# Patient Record
Sex: Male | Born: 1980 | Race: Black or African American | Hispanic: No | Marital: Single | Smoking: Current every day smoker
Health system: Southern US, Community
[De-identification: ages and names within clinical notes are randomized; demographics above are authoritative.]

---

## 2013-01-04 ENCOUNTER — Emergency Department (HOSPITAL_COMMUNITY)
Admission: EM | Admit: 2013-01-04 | Discharge: 2013-01-04 | Disposition: A | Discharge status: Home / Self Care | Payer: Self-pay | Attending: Emergency Medicine | Admitting: Emergency Medicine

## 2013-01-04 ENCOUNTER — Encounter (HOSPITAL_COMMUNITY): Payer: Self-pay | Admitting: Emergency Medicine

## 2013-01-04 ENCOUNTER — Emergency Department (HOSPITAL_COMMUNITY): Payer: Self-pay

## 2013-01-04 DIAGNOSIS — R51 Headache: Secondary | ICD-10-CM | POA: Insufficient documentation

## 2013-01-04 DIAGNOSIS — F17200 Nicotine dependence, unspecified, uncomplicated: Secondary | ICD-10-CM | POA: Insufficient documentation

## 2013-01-04 DIAGNOSIS — R079 Chest pain, unspecified: Secondary | ICD-10-CM | POA: Insufficient documentation

## 2013-01-04 DIAGNOSIS — R5381 Other malaise: Secondary | ICD-10-CM | POA: Insufficient documentation

## 2013-01-04 DIAGNOSIS — R509 Fever, unspecified: Secondary | ICD-10-CM | POA: Insufficient documentation

## 2013-01-04 DIAGNOSIS — J111 Influenza due to unidentified influenza virus with other respiratory manifestations: Secondary | ICD-10-CM

## 2013-01-04 DIAGNOSIS — R61 Generalized hyperhidrosis: Secondary | ICD-10-CM | POA: Insufficient documentation

## 2013-01-04 MED ORDER — ACETAMINOPHEN 325 MG PO TABS
650.0000 mg | ORAL_TABLET | Freq: Once | ORAL | Status: AC
  Administered 2013-01-04: 650 mg via ORAL
  Filled 2013-01-04: qty 2

## 2013-01-04 MED ORDER — HYDROCOD POLST-CHLORPHEN POLST 10-8 MG/5ML PO LQCR: 5.0000 mL | Freq: Two times a day (BID) | ORAL | Status: DC | PRN

## 2013-01-04 NOTE — ED Notes (Signed)
Pt presents with EMS, c/o fever, cough, and rib pain. He is hot to touch. Last dose of Tylenol was 5-6 hours ago. Pt also c/o HA. Sx's onset 2 days ago. Denies N/V/D.  

## 2013-01-04 NOTE — ED Notes (Signed)
Pt admits to cough and night sweats. States really bad headache and pain in his ribs when he coughs. States that for the last 3 days he has been very tired. He works 2 jobs and unable to go since Wednesday of last week. He needs a MD note to return.  Denies SOB. No DOE with ambulation. Pt was ambulatory upon arrival with EMS.

## 2013-01-04 NOTE — ED Provider Notes (Signed)
CSN: [id number]     Arrival date & time 01/04/13  0251 History   First MD Initiated Contact with Patient 01/04/13 0252     No chief complaint on file.  (Consider location/radiation/quality/duration/timing/severity/associated sxs/prior Treatment) HPI 32 year old male presents to emergency room with 3 days of fatigue, headache, cough, rib pain, fever, or night sweats.  No flu shot.  Patient reports he has had contact with sick contacts.  No shortness of breath, no dyspnea on ambulation.  Patient's been taking Tylenol and ibuprofen.  No neck stiffness. History reviewed. No pertinent past medical history. History reviewed. No pertinent past surgical history. No family history on file. History  Substance Use Topics  . Smoking status: Current Every Day Smoker -- 0.50 packs/day for 5 years    Types: Cigarettes  . Smokeless tobacco: Not on file  . Alcohol Use: Yes     Comment: pt states not a heavy drinker or does not smoke often    Review of Systems  See History of Present Illness; otherwise all other systems are reviewed and negative Allergies  Review of patient's allergies indicates no known allergies.  Home Medications   Current Outpatient Rx  Name  Route  Sig  Dispense  Refill  . acetaminophen (TYLENOL) 500 MG tablet   Oral   Take 1,000 mg by mouth every 6 (six) hours as needed for mild pain or headache.         . ibuprofen (ADVIL,MOTRIN) 200 MG tablet   Oral   Take 600 mg by mouth every 8 (eight) hours as needed for headache or moderate pain.         . chlorpheniramine-HYDROcodone (TUSSIONEX PENNKINETIC ER) 10-8 MG/5ML LQCR   Oral   Take 5 mLs by mouth every 12 (twelve) hours as needed for cough.   115 mL   0    BP 125/68  Pulse 76  Temp(Src) 99.2 F (37.3 C) (Oral)  Resp 16  SpO2 99% Physical Exam  Nursing note and vitals reviewed. Constitutional: He is oriented to person, place, and time. He appears well-developed and well-nourished.  HENT:  Head:  Normocephalic and atraumatic.  Right Ear: External ear normal.  Left Ear: External ear normal.  Nose: Nose normal.  Mouth/Throat: Oropharynx is clear and moist.  Eyes: Conjunctivae and EOM are normal. Pupils are equal, round, and reactive to light.  Neck: Normal range of motion. Neck supple. No JVD present. No tracheal deviation present. No thyromegaly present.  Cool rigidity, patient with full range of motion of the neck.  No increase in headache with flexion or extension  Cardiovascular: Normal rate, regular rhythm, normal heart sounds and intact distal pulses.  Exam reveals no gallop and no friction rub.   No murmur heard. Pulmonary/Chest: Effort normal and breath sounds normal. No stridor. No respiratory distress. He has no wheezes. He has no rales. He exhibits no tenderness.  Abdominal: Soft. Bowel sounds are normal. He exhibits no distension and no mass. There is no tenderness. There is no rebound and no guarding.  Musculoskeletal: Normal range of motion. He exhibits no edema and no tenderness.  Lymphadenopathy:    He has no cervical adenopathy.  Neurological: He is alert and oriented to person, place, and time. He has normal reflexes. No cranial nerve deficit. He exhibits normal muscle tone. Coordination normal.  Skin: Skin is warm and dry. No rash noted. No erythema. No pallor.  Psychiatric: He has a normal mood and affect. His behavior is normal. Judgment and thought content  normal.    ED Course  Procedures (including critical care time) Labs Review Labs Reviewed  RESPIRATORY VIRUS PANEL   Imaging Review Dg Chest 2 View  01/04/2013   CLINICAL DATA:  Chest pain.  EXAM: CHEST  2 VIEW  COMPARISON:  None.  FINDINGS: The heart size and mediastinal contours are within normal limits. Both lungs are clear of consolidation or edema. Evidence of calcified granuloma at the left base. The visualized skeletal structures are unremarkable.  IMPRESSION: No active cardiopulmonary disease.    Electronically Signed   By: Tiburcio Pea M.D.   On: 01/04/2013 04:34    EKG Interpretation   None       MDM   1. Influenza-like illness    33 year old with 3 days of fatigue, fever, bodyaches, cough, and headache.  No signs of nuchal rigidity, or meningitis.  On exam.  Patient most likely has flulike illness.  Will treat with Tylenol, ibuprofen, and Tussionex.  Work note given.    Olivia Mackie, MD 01/05/13 [phone]

## 2013-01-05 LAB — RESPIRATORY VIRUS PANEL
Adenovirus: NOT DETECTED
Influenza A H1: NOT DETECTED
Influenza A H3: DETECTED — AB
Influenza A: DETECTED — AB
Parainfluenza 1: NOT DETECTED
Respiratory Syncytial Virus A: NOT DETECTED
Respiratory Syncytial Virus B: NOT DETECTED
Rhinovirus: NOT DETECTED

## 2013-11-21 ENCOUNTER — Emergency Department (HOSPITAL_COMMUNITY)
Admission: EM | Admit: 2013-11-21 | Discharge: 2013-11-21 | Disposition: A | Discharge status: Home / Self Care | Payer: Self-pay | Attending: Emergency Medicine | Admitting: Emergency Medicine

## 2013-11-21 ENCOUNTER — Encounter (HOSPITAL_COMMUNITY): Payer: Self-pay | Admitting: Emergency Medicine

## 2013-11-21 ENCOUNTER — Emergency Department (HOSPITAL_COMMUNITY): Payer: Self-pay

## 2013-11-21 DIAGNOSIS — J209 Acute bronchitis, unspecified: Secondary | ICD-10-CM | POA: Insufficient documentation

## 2013-11-21 DIAGNOSIS — J40 Bronchitis, not specified as acute or chronic: Secondary | ICD-10-CM

## 2013-11-21 DIAGNOSIS — Z720 Tobacco use: Secondary | ICD-10-CM | POA: Insufficient documentation

## 2013-11-21 MED ORDER — HYDROCODONE-ACETAMINOPHEN 5-325 MG PO TABS: 2.0000 | ORAL_TABLET | ORAL | Status: DC | PRN

## 2013-11-21 MED ORDER — ACETAMINOPHEN 325 MG PO TABS
650.0000 mg | ORAL_TABLET | Freq: Once | ORAL | Status: AC
  Administered 2013-11-21: 650 mg via ORAL
  Filled 2013-11-21: qty 2

## 2013-11-21 MED ORDER — AZITHROMYCIN 250 MG PO TABS: ORAL_TABLET | ORAL | Status: DC

## 2013-11-21 NOTE — ED Notes (Signed)
Pt ambulatory upon DC. He reports he is leaving with ALL belongings he arrived with. Pt was advised to obtain a PCP and was given [facility].

## 2013-11-21 NOTE — ED Provider Notes (Signed)
CSN: [id number]     Arrival date & time 11/21/13  0216 History   First MD Initiated Contact with Patient 11/21/13 0604     Chief Complaint  Patient presents with  . Cough  . Chest Pain     (Consider location/radiation/quality/duration/timing/severity/associated sxs/prior Treatment) Patient is a 33 y.o. male presenting with cough. The history is provided by the patient. No language interpreter was used.  Cough Cough characteristics:  Productive Severity:  Moderate Onset quality:  Gradual Duration:  7 days Timing:  Constant Progression:  Worsening Chronicity:  New Smoker: yes   Context: upper respiratory infection   Relieved by:  Nothing Worsened by:  Nothing tried Associated symptoms: shortness of breath   Associated symptoms: no chest pain and no fever   Risk factors: no recent infection   Pt reports he has had fever and cough for a week.  Pt reports pain in ribs from coughing  History reviewed. No pertinent past medical history. History reviewed. No pertinent past surgical history. History reviewed. No pertinent family history. History  Substance Use Topics  . Smoking status: Current Every Day Smoker -- 0.50 packs/day for 5 years    Types: Cigarettes  . Smokeless tobacco: Not on file  . Alcohol Use: Yes     Comment: pt states not a heavy drinker or does not smoke often    Review of Systems  Constitutional: Negative for fever.  Respiratory: Positive for cough and shortness of breath.   Cardiovascular: Negative for chest pain.  All other systems reviewed and are negative.     Allergies  Review of patient's allergies indicates no known allergies.  Home Medications   Prior to Admission medications   Medication Sig Start Date End Date Taking? Authorizing Provider  ibuprofen (ADVIL,MOTRIN) 200 MG tablet Take 400 mg by mouth every 8 (eight) hours as needed for headache or moderate pain.    Yes Historical Provider, MD  acetaminophen (TYLENOL) 500 MG tablet Take  1,000 mg by mouth every 6 (six) hours as needed for mild pain or headache.    Historical Provider, MD  chlorpheniramine-HYDROcodone (TUSSIONEX PENNKINETIC ER) 10-8 MG/5ML LQCR Take 5 mLs by mouth every 12 (twelve) hours as needed for cough. 01/04/13   Olivia Mackielga M Otter, MD   BP 123/75 mmHg  Pulse 88  Temp(Src) 102.5 F (39.2 C) (Oral)  Resp 20  Ht 5\' 7"  (1.702 m)  Wt 150 lb (68.04 kg)  BMI 23.49 kg/m2  SpO2 99% Physical Exam  Constitutional: He is oriented to person, place, and time. He appears well-developed and well-nourished.  HENT:  Head: Normocephalic and atraumatic.  Right Ear: External ear normal.  Left Ear: External ear normal.  Nose: Nose normal.  Mouth/Throat: Oropharynx is clear and moist.  Eyes: Conjunctivae and EOM are normal. Pupils are equal, round, and reactive to light.  Neck: Normal range of motion.  Cardiovascular: Normal rate and normal heart sounds.   Pulmonary/Chest: Effort normal and breath sounds normal.  Abdominal: Soft. He exhibits no distension.  Musculoskeletal: Normal range of motion.  Neurological: He is alert and oriented to person, place, and time.  Skin: Skin is warm.  Psychiatric: He has a normal mood and affect.  Nursing note and vitals reviewed.   ED Course  Procedures (including critical care time) Labs Review Labs Reviewed - No data to display  Imaging Review Dg Chest 2 View  11/21/2013   CLINICAL DATA:  Cough for 5 days.  Smoker.  EXAM: CHEST  2 VIEW  COMPARISON:  01/04/2013  FINDINGS: Hyperinflation in the lungs with mild peribronchial thickening suggesting chronic bronchitis. No focal airspace disease or consolidation. No blunting of costophrenic angles. No pneumothorax. Heart size and pulmonary vascularity are normal. Mediastinal contours appear intact. No change since previous study.  IMPRESSION: Pulmonary hyperinflation with chronic bronchitic changes. No evidence of active pulmonary disease.   Electronically Signed   By: Burman NievesWilliam  Stevens  M.D.   On: 11/21/2013 03:14     EKG Interpretation None      MDM     Final diagnoses:  Bronchitis   zithromax Hydrocodone for pain.    Return if any problems.     Elson AreasLeslie K Mariusz Jubb, PA-C 11/21/13 [phone]  Elson AreasLeslie K Kaston Faughn, PA-C 11/21/13 [phone]  Tomasita CrumbleAdeleke Oni, MD 11/21/13 [phone]

## 2013-11-21 NOTE — Discharge Instructions (Signed)

## 2013-11-21 NOTE — ED Notes (Signed)
Pt arrived to the ED with a complaint of rib pain.  Pt states he has not been feeling well since donating plasma last week.  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  Pt states his rib pain is pressure from within and feels worse when he coughs.

## 2014-02-24 ENCOUNTER — Emergency Department (HOSPITAL_COMMUNITY)
Admission: EM | Admit: 2014-02-24 | Discharge: 2014-02-24 | Disposition: A | Discharge status: Home / Self Care | Payer: Self-pay | Attending: Emergency Medicine | Admitting: Emergency Medicine

## 2014-02-24 ENCOUNTER — Encounter (HOSPITAL_COMMUNITY): Payer: Self-pay | Admitting: Emergency Medicine

## 2014-02-24 DIAGNOSIS — Z79899 Other long term (current) drug therapy: Secondary | ICD-10-CM | POA: Insufficient documentation

## 2014-02-24 DIAGNOSIS — Z720 Tobacco use: Secondary | ICD-10-CM | POA: Insufficient documentation

## 2014-02-24 DIAGNOSIS — N4889 Other specified disorders of penis: Secondary | ICD-10-CM

## 2014-02-24 DIAGNOSIS — N508 Other specified disorders of male genital organs: Secondary | ICD-10-CM | POA: Insufficient documentation

## 2014-02-24 MED ORDER — METRONIDAZOLE 500 MG PO TABS
2000.0000 mg | ORAL_TABLET | Freq: Once | ORAL | Status: AC
  Administered 2014-02-24: 2000 mg via ORAL
  Filled 2014-02-24: qty 4

## 2014-02-24 NOTE — ED Provider Notes (Signed)
CSN: [id number]     Arrival date & time 02/24/14  1759 History  This chart was scribed for non-physician practitioner Aija Scarfo A. Hector Shade, PA-C working with Richardean Canal, MD by Conchita Paris, ED Scribe. This patient was seen in [facility] and the patient's care was started at 8:44 PM.   Chief Complaint  Patient presents with  . Exposure to STD   HPI  HPI Comments: Andre Lucas is a 34 y.o. male who presents to the Emergency Department complaining of possible exposure to trichomonas one week ago. He denies penile discharge, dysuria, testicular pain or swelling and fever.  History reviewed. No pertinent past medical history. History reviewed. No pertinent past surgical history. History reviewed. No pertinent family history. History  Substance Use Topics  . Smoking status: Current Every Day Smoker -- 0.50 packs/day for 5 years    Types: Cigarettes  . Smokeless tobacco: Not on file  . Alcohol Use: Yes     Comment: pt states not a heavy drinker or does not smoke often    Review of Systems  Constitutional: Negative for fever.  Gastrointestinal: Negative.   Genitourinary: Negative.  Negative for dysuria, discharge, scrotal swelling and testicular pain.  Skin: Negative.     Allergies  Review of patient's allergies indicates no known allergies.  Home Medications   Prior to Admission medications   Medication Sig Start Date End Date Taking? Authorizing Provider  acetaminophen (TYLENOL) 500 MG tablet Take 1,000 mg by mouth every 6 (six) hours as needed for mild pain or headache.   Yes Historical Provider, MD  cetirizine (ZYRTEC) 10 MG tablet Take 10 mg by mouth daily.   Yes Historical Provider, MD  ibuprofen (ADVIL,MOTRIN) 200 MG tablet Take 400 mg by mouth every 8 (eight) hours as needed for headache or moderate pain.    Yes Historical Provider, MD  azithromycin (ZITHROMAX Z-PAK) 250 MG tablet Two tablets 1st day then one a day days 2-5 Patient not taking: Reported on 02/24/2014 11/21/13    Elson Areas, PA-C  chlorpheniramine-HYDROcodone [facility]) 10-8 MG/5ML LQCR Take 5 mLs by mouth every 12 (twelve) hours as needed for cough. Patient not taking: Reported on 02/24/2014 01/04/13   Olivia Mackie, MD  HYDROcodone-acetaminophen (NORCO/VICODIN) 5-325 MG per tablet Take 2 tablets by mouth every 4 (four) hours as needed. Patient not taking: Reported on 02/24/2014 11/21/13   Elson Areas, PA-C   BP 129/70 mmHg  Pulse 89  Temp(Src) 98.4 F (36.9 C) (Oral)  Resp 18  SpO2 99% Physical Exam  Constitutional: He is oriented to person, place, and time. He appears well-developed and well-nourished.  HENT:  Head: Normocephalic and atraumatic.  Eyes: EOM are normal.  Neck: Normal range of motion.  Cardiovascular: Normal rate.   Pulmonary/Chest: Effort normal.  Genitourinary:  circumcised penis without discharge rash or redness No testicular pain or swelling. No inguinal adenopathy.  Musculoskeletal: Normal range of motion.  Neurological: He is alert and oriented to person, place, and time.  Skin: Skin is warm and dry.  Psychiatric: He has a normal mood and affect. His behavior is normal.  Nursing note and vitals reviewed.   ED Course  Procedures  DIAGNOSTIC STUDIES: Oxygen Saturation is 99% on room air, normal by my interpretation.    COORDINATION OF CARE: 8:50 PM Discussed treatment plan with pt at bedside and pt agreed to plan.  Labs Review Labs Reviewed - No data to display  Imaging Review No results found.   EKG  Interpretation None      MDM   Final diagnoses:  None  1. STD exposure  He reports his girlfriend has trichomonas. He denies symptoms of discharge, fever, pain. He has a normal exam. Discussed treatment for trichomonas, written information provided to patient. Discussed testing for other STD and patient prefers [facility] with [facility] for these tests.  I personally performed the services described in this documentation,  which was scribed in my presence. The recorded information has been reviewed and is accurate.       Arnoldo HookerShari A Alonnah Lampkins, PA-C 02/25/14 0058  Richardean Canalavid H Yao, MD 02/26/14 [phone]

## 2014-02-24 NOTE — ED Notes (Signed)
Patient presents to Ed with c/o exposure to STD.  Pt reports that he was just told by partner that his partner has been diagnosed of STD--- pt started to "itch all over his penis"; pt denies rashes or discharges.  Pt came to ED for STD evaluation.

## 2014-02-24 NOTE — Discharge Instructions (Signed)
Trichomoniasis Trichomoniasis is an infection caused by an organism called Trichomonas. The infection can affect both women and men. In women, the outer male genitalia and the vagina are affected. In men, the penis is mainly affected, but the prostate and other reproductive organs can also be involved. Trichomoniasis is a sexually transmitted infection (STI) and is most often passed to another person through sexual contact.  RISK FACTORS  Having unprotected sexual intercourse.  Having sexual intercourse with an infected partner. SIGNS AND SYMPTOMS  Symptoms of trichomoniasis in women include:  Abnormal gray-green frothy vaginal discharge.  Itching and irritation of the vagina.  Itching and irritation of the area outside the vagina. Symptoms of trichomoniasis in men include:   Penile discharge with or without pain.  Pain during urination. This results from inflammation of the urethra. DIAGNOSIS  Trichomoniasis may be found during a Pap test or physical exam. Your health care provider may use one of the following methods to help diagnose this infection:  Examining vaginal discharge under a microscope. For men, urethral discharge would be examined.  Testing the pH of the vagina with a test tape.  Using a vaginal swab test that checks for the Trichomonas organism. A test is available that provides results within a few minutes.  Doing a culture test for the organism. This is not usually needed. TREATMENT   You may be given medicine to fight the infection. Women should inform their health care provider if they could be or are pregnant. Some medicines used to treat the infection should not be taken during pregnancy.  Your health care provider may recommend over-the-counter medicines or creams to decrease itching or irritation.  Your sexual partner will need to be treated if infected. HOME CARE INSTRUCTIONS   Take medicines only as directed by your health care provider.  Take  over-the-counter medicine for itching or irritation as directed by your health care provider.  Do not have sexual intercourse while you have the infection.  Women should not douche or wear tampons while they have the infection.  Discuss your infection with your partner. Your partner may have gotten the infection from you, or you may have gotten it from your partner.  Have your sex partner get examined and treated if necessary.  Practice safe, informed, and protected sex.  See your health care provider for other STI testing. SEEK MEDICAL CARE IF:   You still have symptoms after you finish your medicine.  You develop abdominal pain.  You have pain when you urinate.  You have bleeding after sexual intercourse.  You develop a rash.  Your medicine makes you sick or makes you throw up (vomit). MAKE SURE YOU:  Understand these instructions.  Will watch your condition.  Will get help right away if you are not doing well or get worse. Document Released: 06/27/2000 Document Revised: 05/18/2013 Document Reviewed: 10/13/2012 Exit[employer] Patient Information 2015 [employer]. This information is not intended to replace advice given to you by your health care provider. Make sure you discuss any questions you have with your health care provider.  

## 2016-10-16 ENCOUNTER — Emergency Department (HOSPITAL_COMMUNITY): Payer: Self-pay

## 2016-10-16 ENCOUNTER — Emergency Department (HOSPITAL_COMMUNITY)
Admission: EM | Admit: 2016-10-16 | Discharge: 2016-10-16 | Disposition: A | Discharge status: Home / Self Care | Payer: Self-pay | Attending: Emergency Medicine | Admitting: Emergency Medicine

## 2016-10-16 ENCOUNTER — Encounter (HOSPITAL_COMMUNITY): Payer: Self-pay | Admitting: Emergency Medicine

## 2016-10-16 DIAGNOSIS — Z79899 Other long term (current) drug therapy: Secondary | ICD-10-CM | POA: Insufficient documentation

## 2016-10-16 DIAGNOSIS — F17210 Nicotine dependence, cigarettes, uncomplicated: Secondary | ICD-10-CM | POA: Insufficient documentation

## 2016-10-16 DIAGNOSIS — M25532 Pain in left wrist: Secondary | ICD-10-CM | POA: Insufficient documentation

## 2016-10-16 DIAGNOSIS — M25531 Pain in right wrist: Secondary | ICD-10-CM

## 2016-10-16 MED ORDER — KETOROLAC TROMETHAMINE 30 MG/ML IJ SOLN
15.0000 mg | Freq: Once | INTRAMUSCULAR | Status: AC
  Administered 2016-10-16: 15 mg via INTRAMUSCULAR
  Filled 2016-10-16: qty 1

## 2016-10-16 NOTE — ED Provider Notes (Signed)
[facility]-EMERGENCY DEPT Provider Note   CSN: [id number] Arrival date & time: 10/16/16  1215     History   Chief Complaint Chief Complaint  Patient presents with  . Wrist Pain    HPI Andre Lucas is a 36 y.o. male without significant past medical history, presenting to the ED for acute onset of bilateral dorsal wrist pain that began about 2 weeks ago. Patient states he began lifting free weights at the gym, and the following day began having b/l wrist pain, right worse than left, that is worse with movement and and weight bearing. 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 He has taken OTC Tylenol, Advil and aspirin, with most relief from Advil. No numbness or tingling to hands swelling, wounds, or any other complaints.  The history is provided by the patient.    History reviewed. No pertinent past medical history.  There are no active problems to display for this patient.   History reviewed. No pertinent surgical history.     Home Medications    Prior to Admission medications   Medication Sig Start Date End Date Taking? Authorizing Provider  acetaminophen (TYLENOL) 500 MG tablet Take 1,000 mg by mouth every 6 (six) hours as needed for mild pain or headache.    [provider]  azithromycin (ZITHROMAX Z-PAK) 250 MG tablet Two tablets 1st day then one a day days 2-5 Patient not taking: Reported on 02/24/2014 11/21/13   Elson Areas, PA-C  cetirizine (ZYRTEC) 10 MG tablet Take 10 mg by mouth daily.    [provider]  chlorpheniramine-HYDROcodone (TUSSIONEX PENNKINETIC ER) 10-8 MG/5ML LQCR Take 5 mLs by mouth every 12 (twelve) hours as needed for cough. Patient not taking: Reported on 02/24/2014 01/04/13   Marisa Severin, MD  HYDROcodone-acetaminophen (NORCO/VICODIN) 5-325 MG per tablet Take 2 tablets by mouth every 4 (four) hours as needed. Patient not taking: Reported on 02/24/2014 11/21/13   Elson Areas,  PA-C  ibuprofen (ADVIL,MOTRIN) 200 MG tablet Take 400 mg by mouth every 8 (eight) hours as needed for headache or moderate pain.     [provider]    Family History No family history on file.  Social History Social History  Substance Use Topics  . Smoking status: Current Every Day Smoker    Packs/day: 0.50    Years: 5.00    Types: Cigarettes  . Smokeless tobacco: Never Used  . Alcohol use Yes     Comment: pt states not a heavy drinker or does not smoke often     Allergies   Patient has no known allergies.   Review of Systems Review of Systems  Musculoskeletal: Positive for arthralgias. Negative for joint swelling.  Skin: Negative for color change.  Neurological: Negative for weakness and numbness.     Physical Exam Updated Vital Signs BP 138/85 (BP Location: Right Arm)   Pulse 67   Temp 97.6 F (36.4 C) (Oral)   Resp 16   Ht  (1.702 m)   Wt 72.6 kg (160 lb)   SpO2 100%   BMI 25.06 kg/m   Physical Exam  Constitutional: He appears well-developed and well-nourished. No distress.  HENT:  Head: Normocephalic and atraumatic.  Eyes: Conjunctivae are normal.  Cardiovascular: Intact distal pulses.   Pulmonary/Chest: Effort normal.  Musculoskeletal:  Bilateral dorsal wrists with tenderness. No edema, erythema, or deformities. Full normal range of motion. No anatomical snuffbox tenderness. Negative Tinel's.  Neurological:  Normal sensation  Psychiatric: He has a normal mood and affect. His behavior is normal.  Nursing note and vitals reviewed.  ED Treatments / Results  Labs (all labs ordered are listed, but only abnormal results are displayed) Labs Reviewed - No data to display  EKG  EKG Interpretation None       Radiology Dg Wrist Complete Left  Result Date: 10/16/2016 CLINICAL DATA:  Pain began after lifting weights. This was 10 days ago. EXAM: LEFT WRIST - COMPLETE 3+ VIEW COMPARISON:  None. FINDINGS: There is no evidence of fracture  or dislocation. There is no evidence of arthropathy or other focal bone abnormality. Soft tissues are unremarkable. IMPRESSION: Negative. Electronically Signed   By: Elsie Stain M.D.   On: 10/16/2016 13:41   Dg Wrist Complete Right  Result Date: 10/16/2016 CLINICAL DATA:  Worsening wrist pain after lifting weights. EXAM: RIGHT WRIST - COMPLETE 3+ VIEW COMPARISON:  None. FINDINGS: There is no evidence of fracture or dislocation. There is no evidence of arthropathy or other focal bone abnormality. Mild ulnar positive variance. Soft tissues are unremarkable. IMPRESSION: Mild ulnar positive variance.  No acute osseous abnormality. Electronically Signed   By: Obie Dredge M.D.   On: 10/16/2016 13:37    Procedures Procedures (including critical care time)  Medications Ordered in ED Medications  ketorolac (TORADOL) 30 MG/ML injection 15 mg (15 mg Intramuscular Given 10/16/16 1308)     Initial Impression / Assessment and Plan / ED Course  I have reviewed the triage vital signs and the nursing notes.  Pertinent labs & imaging results that were available during my care of the patient were reviewed by me and considered in my medical decision making (see chart for details).     Presenting with bilateral wrist pain. No signs or symptoms to suggest carpal tunnel syndrome. NV intact. X-ray of right wrist showing mild positive ulnar variation, left wrist x-rays negative. Will place right wrist in Velcro splint for comfort. RICE. Hand specialist referral given as needed. Patient is safe for discharge.  Patient discussed with Dr. Silverio Lay.  Discussed results, findings, treatment and follow up. Patient advised of return precautions. Patient verbalized understanding and agreed with plan.   Final Clinical Impressions(s) / ED Diagnoses   Final diagnoses:  Bilateral wrist pain    New Prescriptions New Prescriptions   No medications on file     Rus[place], [place] N, PA-C 10/16/16 1357    Charlynne Pander, MD 10/17/16 [phone]

## 2016-10-16 NOTE — Discharge Instructions (Signed)
Please read instructions below. Apply ice to your wrists for 20 minutes at a time. You can take  of Advil every 6 hours as needed for pain. Schedule an appointment with the hand specialist in 1 week for follow-up on your injury. Return to the ER for new or concerning symptoms.

## 2016-10-16 NOTE — ED Triage Notes (Signed)
Pt reports bilat wrist pain, states, "I think it's arthritis." Pt denies recent injury. No swelling noted, pt using both hands.

## 2018-04-04 ENCOUNTER — Ambulatory Visit (HOSPITAL_COMMUNITY)
Admission: EM | Admit: 2018-04-04 | Discharge: 2018-04-04 | Disposition: A | Discharge status: Home / Self Care | Payer: Self-pay | Attending: Family Medicine | Admitting: Family Medicine

## 2018-04-04 ENCOUNTER — Other Ambulatory Visit: Payer: Self-pay

## 2018-04-04 DIAGNOSIS — R0981 Nasal congestion: Secondary | ICD-10-CM

## 2018-04-04 DIAGNOSIS — J302 Other seasonal allergic rhinitis: Secondary | ICD-10-CM

## 2018-04-04 MED ORDER — PREDNISONE 20 MG PO TABS: 20.0000 mg | ORAL_TABLET | Freq: Two times a day (BID) | ORAL | 0 refills | Status: AC

## 2018-04-04 NOTE — ED Provider Notes (Signed)
[facility]    CSN: [id number] Arrival date & time: 04/04/18  1117     History   Chief Complaint Chief Complaint  Patient presents with   Cough   Nasal Congestion    HPI Andre Lucas is a 38 y.o. male.   HPI   Itchy throat and eyes, runny and stuffy nose, mild cough for the last 2 days.  "might be allergies". Sent from work for evaluation for contagiousness.  No fever or chills No chest pian or dyspnea    No past medical history on file.  There are no active problems to display for this patient.   No past surgical history on file.     Home Medications    Prior to Admission medications   Medication Sig Start Date End Date Taking? Authorizing Provider  acetaminophen (TYLENOL) 500 MG tablet Take 1,000 mg by mouth every 6 (six) hours as needed for mild pain or headache.    [provider]  cetirizine (ZYRTEC) 10 MG tablet Take 10 mg by mouth daily.    [provider]  ibuprofen (ADVIL,MOTRIN) 200 MG tablet Take 400 mg by mouth every 8 (eight) hours as needed for headache or moderate pain.     [provider]  predniSONE (DELTASONE) 20 MG tablet Take 1 tablet (20 mg total) by mouth 2 (two) times daily with a meal. 04/04/18   Eustace Moore, MD    Family History No family history on file.  Social History Social History   Tobacco Use   Smoking status: Current Every Day Smoker    Packs/day: 0.50    Years: 5.00    Pack years: 2.50    Types: Cigarettes   Smokeless tobacco: Never Used  Substance Use Topics   Alcohol use: Yes    Comment: pt states not a heavy drinker or does not smoke often   Drug use: Yes    Types: Marijuana     Allergies   Patient has no known allergies.   Review of Systems Review of Systems  Constitutional: Negative for chills and fever.  HENT: Positive for congestion and rhinorrhea. Negative for ear pain and sore throat.   Eyes: Positive for itching. Negative for pain and visual  disturbance.  Respiratory: Negative for cough and shortness of breath.   Cardiovascular: Negative for chest pain and palpitations.  Gastrointestinal: Negative for abdominal pain and vomiting.  Genitourinary: Negative for dysuria and hematuria.  Musculoskeletal: Negative for arthralgias and back pain.  Skin: Negative for color change and rash.  Neurological: Negative for seizures and syncope.  All other systems reviewed and are negative.    Physical Exam Triage Vital Signs ED Triage Vitals  Enc Vitals Group     BP 04/04/18 1130 125/80     Pulse Rate 04/04/18 1130 69     Resp 04/04/18 1130 16     Temp 04/04/18 1130 98.2 F (36.8 C)     Temp Source 04/04/18 1130 Oral     SpO2 04/04/18 1130 100 %     Weight 04/04/18 1132 160 lb (72.6 kg)     Height 04/04/18 1132 5\' 7"  (1.702 m)     Head Circumference --      Peak Flow --      Pain Score 04/04/18 1132 0     Pain Loc --      Pain Edu? --      Excl. in GC? --    No data found.  Updated Vital  Signs BP 125/80 (BP Location: Right Arm)    Pulse 69    Temp 98.2 F (36.8 C) (Oral)    Resp 16    Ht 5\' 7"  (1.702 m)    Wt 72.6 kg    SpO2 100%    BMI 25.06 kg/m      Physical Exam Constitutional:      General: He is not in acute distress.    Appearance: He is well-developed.  HENT:     Head: Normocephalic and atraumatic.     Right Ear: Tympanic membrane, ear canal and external ear normal.     Left Ear: Tympanic membrane, ear canal and external ear normal.     Nose: Congestion and rhinorrhea present.     Comments: clear    Mouth/Throat:     Mouth: Mucous membranes are moist.  Eyes:     Conjunctiva/sclera: Conjunctivae normal.     Pupils: Pupils are equal, round, and reactive to light.     Comments: Mild injection  Neck:     Musculoskeletal: Normal range of motion.  Cardiovascular:     Rate and Rhythm: Normal rate and regular rhythm.     Heart sounds: Normal heart sounds.  Pulmonary:     Effort: Pulmonary effort is normal.  No respiratory distress.     Breath sounds: Normal breath sounds.  Abdominal:     General: There is no distension.     Palpations: Abdomen is soft.  Musculoskeletal: Normal range of motion.  Lymphadenopathy:     Cervical: No cervical adenopathy.  Skin:    General: Skin is warm and dry.  Neurological:     General: No focal deficit present.     Mental Status: He is alert.  Psychiatric:        Mood and Affect: Mood normal.        Behavior: Behavior normal.      UC Treatments / Results  Labs (all labs ordered are listed, but only abnormal results are displayed) Labs Reviewed - No data to display  EKG None  Radiology No results found.  Procedures Procedures (including critical care time)  Medications Ordered in UC Medications - No data to display  Initial Impression / Assessment and Plan / UC Course  I have reviewed the triage vital signs and the nursing notes.  Pertinent labs & imaging results that were available during my care of the patient were reviewed by me and considered in my medical decision making (see chart for details).      Final Clinical Impressions(s) / UC Diagnoses   Final diagnoses:  Nasal congestion  Seasonal allergies     Discharge Instructions     I believe your symptoms are mostly caused by allergies.  I am giving you prednisone to take for a few days.  This should give you rapid improvement. In addition go back on the Claritin or Zyrtec for a few days Drink plenty of fluids Expect improvement in the next few days   ED Prescriptions    Medication Sig Dispense Auth. Provider   predniSONE (DELTASONE) 20 MG tablet Take 1 tablet (20 mg total) by mouth 2 (two) times daily with a meal. 10 tablet Eustace Moore, MD     Controlled Substance Prescriptions [place] Controlled Substance Registry consulted? Not Applicable   Eustace Moore, MD 04/04/18 1220

## 2018-04-04 NOTE — Discharge Instructions (Addendum)
I believe your symptoms are mostly caused by allergies.  I am giving you prednisone to take for a few days.  This should give you rapid improvement. In addition go back on the Claritin or Zyrtec for a few days Drink plenty of fluids Expect improvement in the next few days

## 2018-04-04 NOTE — ED Triage Notes (Signed)
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 Pt has had no fevers.

## 2018-07-04 ENCOUNTER — Encounter (HOSPITAL_COMMUNITY): Payer: Self-pay | Admitting: *Deleted

## 2018-07-04 ENCOUNTER — Other Ambulatory Visit: Payer: Self-pay

## 2018-07-04 ENCOUNTER — Ambulatory Visit (HOSPITAL_COMMUNITY)
Admission: EM | Admit: 2018-07-04 | Discharge: 2018-07-04 | Disposition: A | Discharge status: Home / Self Care | Payer: Self-pay | Attending: Internal Medicine | Admitting: Internal Medicine

## 2018-07-04 ENCOUNTER — Telehealth: Payer: Self-pay | Admitting: *Deleted

## 2018-07-04 DIAGNOSIS — Z20822 Contact with and (suspected) exposure to covid-19: Secondary | ICD-10-CM

## 2018-07-04 DIAGNOSIS — R430 Anosmia: Secondary | ICD-10-CM

## 2018-07-04 DIAGNOSIS — R481 Agnosia: Secondary | ICD-10-CM

## 2018-07-04 NOTE — ED Provider Notes (Signed)
[facility]    CSN: [id number] Arrival date & time: 07/04/18  1126     History   Chief Complaint No chief complaint on file.   HPI Andre Lucas is a 38 y.o. male.   Andre Lucas presents with complaints of 3 days of cold sweats at night. Today loss of taste, loss of smell. Feels anxious. No pain anywhere. No cough no shortness of breath . No headache. No body aches. Hasn't checked for fever. No nausea vomiting or diarrhea. No known ill contacts. Off of work currently. States he is concerned about Covid-19. Took aspirin and advil which haven't helped. Without contributing medical history.      ROS per HPI, negative if not otherwise mentioned.      History reviewed. No pertinent past medical history.  There are no active problems to display for this patient.   History reviewed. No pertinent surgical history.     Home Medications    Prior to Admission medications   Medication Sig Start Date End Date Taking? Authorizing Provider  ASPIRIN PO Take by mouth.   Yes [provider]  cetirizine (ZYRTEC) 10 MG tablet Take 10 mg by mouth daily.   Yes [provider]  ibuprofen (ADVIL,MOTRIN) 200 MG tablet Take 400 mg by mouth every 8 (eight) hours as needed for headache or moderate pain.    Yes [provider]  acetaminophen (TYLENOL) 500 MG tablet Take 1,000 mg by mouth every 6 (six) hours as needed for mild pain or headache.    [provider]  predniSONE (DELTASONE) 20 MG tablet Take 1 tablet (20 mg total) by mouth 2 (two) times daily with a meal. 04/04/18   Eustace MooreNelson, Yvonne Sue, MD    Family History Family History  Problem Relation Age of Onset  . Healthy Mother   . Healthy Father     Social History Social History   Tobacco Use  . Smoking status: Current Every Day Smoker    Packs/day: 0.50    Years: 5.00    Pack years: 2.50    Types: Cigarettes  . Smokeless tobacco: Never Used  Substance Use Topics  . Alcohol use:  Yes    Alcohol/week: 8.0 standard drinks    Types: 6 Cans of beer, 2 Shots of liquor per week  . Drug use: Yes    Types: Marijuana     Allergies   Patient has no known allergies.   Review of Systems Review of Systems   Physical Exam Triage Vital Signs ED Triage Vitals  Enc Vitals Group     BP 07/04/18 1230 136/88     Pulse Rate 07/04/18 1230 60     Resp 07/04/18 1230 16     Temp 07/04/18 1230 98 F (36.7 C)     Temp Source 07/04/18 1230 Temporal     SpO2 07/04/18 1230 99 %     Weight --      Height --      Head Circumference --      Peak Flow --      Pain Score 07/04/18 1232 0     Pain Loc --      Pain Edu? --      Excl. in GC? --    No data found.  Updated Vital Signs BP 136/88   Pulse 60   Temp 98 F (36.7 C) (Temporal)   Resp 16   SpO2 99%    Physical Exam Constitutional:  Appearance: He is well-developed.  Cardiovascular:     Rate and Rhythm: Normal rate.  Pulmonary:     Effort: Pulmonary effort is normal.  Skin:    General: Skin is warm and dry.  Neurological:     Mental Status: He is alert and oriented to person, place, and time.      UC Treatments / Results  Labs (all labs ordered are listed, but only abnormal results are displayed) Labs Reviewed - No data to display  EKG None  Radiology No results found.  Procedures Procedures (including critical care time)  Medications Ordered in UC Medications - No data to display  Initial Impression / Assessment and Plan / UC Course  I have reviewed the triage vital signs and the nursing notes.  Pertinent labs & imaging results that were available during my care of the patient were reviewed by me and considered in my medical decision making (see chart for details).     Non toxic. Benign physical exam.  Message to [facility] for covid testing provided, patient is anxious about this. No known potential exposure. Encouraged self isolation pending results. Return precautions provided. Patient  verbalized understanding and agreeable to plan.   Final Clinical Impressions(s) / UC Diagnoses   Final diagnoses:  Loss of perception for taste  Loss of perception for smell     Discharge Instructions     You overall appear well today, your vital signs are normal.  I feel you are low risk, even positive for covid-19.  Due to your symptoms in presence of pandemic I do recommend Covid-19 testing. You will be called to set up an appointment time to be tested at our [facility]. Results take about 2-3 days.  Please self isolate until you receive this results.   If develop chest pain , shortness of breath , or otherwise worsening please return or go to the ER.     ED Prescriptions    None     Controlled Substance Prescriptions  Controlled Substance Registry consulted? Not Applicable   Zigmund Gottron, NP 07/04/18 1332

## 2018-07-04 NOTE — Discharge Instructions (Signed)
You overall appear well today, your vital signs are normal.  I feel you are low risk, even positive for covid-19.  Due to your symptoms in presence of pandemic I do recommend Covid-19 testing. You will be called to set up an appointment time to be tested at our [facility]. Results take about 2-3 days.  Please self isolate until you receive this results.   If develop chest pain , shortness of breath , or otherwise worsening please return or go to the ER.

## 2018-07-04 NOTE — Telephone Encounter (Signed)
Patient scheduled for covid testing Monday 07/07/18 @ [facility]. Instructions given and order placed

## 2018-07-04 NOTE — Telephone Encounter (Signed)
-----   Message from Zigmund Gottron, NP sent at 07/04/2018 12:48 PM EDT ----- Regarding: covid testing needed

## 2018-07-04 NOTE — ED Triage Notes (Signed)
C/O loss of sense of taste & smell x 3 days.  Also c/o "night sweats", but denies any fevers, cough, SOB, pain, or any other sxs.

## 2018-07-07 ENCOUNTER — Other Ambulatory Visit: Payer: Self-pay

## 2018-07-07 DIAGNOSIS — Z20822 Contact with and (suspected) exposure to covid-19: Secondary | ICD-10-CM

## 2018-07-12 ENCOUNTER — Telehealth: Payer: Self-pay

## 2018-07-12 LAB — NOVEL CORONAVIRUS, NAA: SARS-CoV-2, NAA: DETECTED — AB

## 2018-07-12 NOTE — Telephone Encounter (Signed)
Notes recorded by Carlisle Beers, RN on 07/12/2018 at 11:42 AM EDT  Pt notified of Covid 19 results. Discussed signs and symptoms and asked pt to call 911 for any worsening of respiratory issues or distress. Discussed self isolation and when to end self isolation.Pt verbalized understanding.  Contains abnormal data Novel Coronavirus, NAA ([facility]) Order: [id number] Status:  Final result  Visible to patient:  No (not released)  Next appt:  None  Dx:  Suspected Covid-19 Virus Infection     Ref Range & Units 5d ago  SARS-CoV-2, NAA Not Detected DetectedAbnormal    Comment: This test was developed and its performance characteristics determined  by [employer]. This test has

## 2020-06-12 ENCOUNTER — Other Ambulatory Visit: Payer: Self-pay

## 2020-06-12 ENCOUNTER — Emergency Department (HOSPITAL_COMMUNITY)
Admission: EM | Admit: 2020-06-12 | Discharge: 2020-06-12 | Disposition: A | Discharge status: Home / Self Care | Payer: Self-pay | Attending: Emergency Medicine | Admitting: Emergency Medicine

## 2020-06-12 DIAGNOSIS — S39012A Strain of muscle, fascia and tendon of lower back, initial encounter: Secondary | ICD-10-CM | POA: Insufficient documentation

## 2020-06-12 DIAGNOSIS — S46912A Strain of unspecified muscle, fascia and tendon at shoulder and upper arm level, left arm, initial encounter: Secondary | ICD-10-CM | POA: Insufficient documentation

## 2020-06-12 DIAGNOSIS — X500XXA Overexertion from strenuous movement or load, initial encounter: Secondary | ICD-10-CM | POA: Insufficient documentation

## 2020-06-12 DIAGNOSIS — F17210 Nicotine dependence, cigarettes, uncomplicated: Secondary | ICD-10-CM | POA: Insufficient documentation

## 2020-06-12 DIAGNOSIS — Y990 Civilian activity done for income or pay: Secondary | ICD-10-CM | POA: Insufficient documentation

## 2020-06-12 MED ORDER — IBUPROFEN 600 MG PO TABS: 600.0000 mg | ORAL_TABLET | Freq: Four times a day (QID) | ORAL | 0 refills | Status: AC | PRN

## 2020-06-12 MED ORDER — CYCLOBENZAPRINE HCL 10 MG PO TABS
10.0000 mg | ORAL_TABLET | Freq: Two times a day (BID) | ORAL | 0 refills | Status: AC | PRN
Start: 2020-06-12 — End: ?

## 2020-06-12 NOTE — ED Provider Notes (Signed)
[facility] EMERGENCY DEPARTMENT Provider Note   CSN: [id number] Arrival date & time: 06/12/20  0935     History No chief complaint on file.   Andre Lucas is a 40 y.o. male.  The history is provided by the patient. No language interpreter was used.     40 year old male with history of tobacco use, presenting with complaint of pain to his left shoulder and his lower back.  Patient report for the past 4 days he has had intermittent pain about the left shoulder and low back.  Described pain as a tightness sensation worse with movement and with prolonged lifting.  Pain mildly improved with over-the-counter Tylenol and ibuprofen.  He attributed pain to having to perform heavy lifting at work usually lifting between 40 to 60 pounds of weight regularly.  He has been at his job for the past several months.  He denies any associated fever runny nose sneezing coughing shortness of breath dysuria hematuria and he denies any specific injury.  He denies any focal numbness or focal weakness.  No bowel bladder incontinence or saddle anesthesia.  No past medical history on file.  There are no problems to display for this patient.   No past surgical history on file.     Family History  Problem Relation Age of Onset  . Healthy Mother   . Healthy Father     Social History   Tobacco Use  . Smoking status: Current Every Day Smoker    Packs/day: 0.50    Years: 5.00    Pack years: 2.50    Types: Cigarettes  . Smokeless tobacco: Never Used  Vaping Use  . Vaping Use: Never used  Substance Use Topics  . Alcohol use: Yes    Alcohol/week: 8.0 standard drinks    Types: 6 Cans of beer, 2 Shots of liquor per week  . Drug use: Yes    Types: Marijuana    Home Medications Prior to Admission medications   Medication Sig Start Date End Date Taking? Authorizing Provider  acetaminophen (TYLENOL) 500 MG tablet Take 1,000 mg by mouth every 6 (six) hours as needed for mild pain or  headache.    [provider]  ASPIRIN PO Take by mouth.    [provider]  cetirizine (ZYRTEC) 10 MG tablet Take 10 mg by mouth daily.    [provider]  ibuprofen (ADVIL,MOTRIN) 200 MG tablet Take 400 mg by mouth every 8 (eight) hours as needed for headache or moderate pain.     [provider]  predniSONE (DELTASONE) 20 MG tablet Take 1 tablet (20 mg total) by mouth 2 (two) times daily with a meal. 04/04/18   Eustace Moore, MD    Allergies    Patient has no known allergies.  Review of Systems   Review of Systems  Constitutional: Negative for fever.  Musculoskeletal: Positive for arthralgias and back pain.  Skin: Negative for rash and wound.    Physical Exam Updated Vital Signs There were no vitals taken for this visit.  Physical Exam Vitals and nursing note reviewed.  Constitutional:      General: He is not in acute distress.    Appearance: He is well-developed.  HENT:     Head: Atraumatic.  Eyes:     Conjunctiva/sclera: Conjunctivae normal.  Musculoskeletal:        General: Tenderness (Mild tenderness to lumbar paraspinal muscle on palpation without any significant midline spine tenderness.  Normal skin) present.  Cervical back: Neck supple.     Comments: Left shoulder with mild tenderness about the left scapula however shoulder with full range of motion no deformity noted.  Skin:    Findings: No rash.  Neurological:     Mental Status: He is alert.     ED Results / Procedures / Treatments   Labs (all labs ordered are listed, but only abnormal results are displayed) Labs Reviewed - No data to display  EKG None  Radiology No results found.  Procedures Procedures   Medications Ordered in ED Medications - No data to display  ED Course  I have reviewed the triage vital signs and the nursing notes.  Pertinent labs & imaging results that were available during my care of the patient were reviewed by me and considered  in my medical decision making (see chart for details).    MDM Rules/Calculators/A&P                          BP 132/73 (BP Location: Right Arm)   Pulse 89   Temp 98.2 F (36.8 C) (Oral)   Resp 17   Ht 5\' 7"  (1.702 m)   Wt 72.6 kg   SpO2 99%   BMI 25.07 kg/m   Final Clinical Impression(s) / ED Diagnoses Final diagnoses:  Strain of lumbar region, initial encounter  Left shoulder strain, initial encounter    Rx / DC Orders ED Discharge Orders    None     9:54 AM Patient here with pain to left shoulder and lower back from regular heavy lifting.  No specific injury to suggest fracture or dislocation.  No neuro deficit.  No signs of infection.  Ambulate without difficulty.  Will prescribe anti-inflammatory Acacian and muscle relaxant as needed and will give orthopedic referral.  Return precaution given.    , PA-C 06/12/20 1121    [place], 06/14/20, DO 06/12/20 1336

## 2020-06-12 NOTE — ED Notes (Signed)
Reviewed discharge instructions with patient. Follow-up care and medications reviewed. Patient  verbalized understanding. Patient A&Ox4, VSS, and ambulatory with steady gait upon discharge.  °

## 2020-09-01 ENCOUNTER — Encounter (HOSPITAL_COMMUNITY): Payer: Self-pay

## 2020-09-01 ENCOUNTER — Emergency Department (HOSPITAL_COMMUNITY)
Admission: EM | Admit: 2020-09-01 | Discharge: 2020-09-01 | Disposition: A | Discharge status: Home / Self Care | Payer: Self-pay | Attending: Emergency Medicine | Admitting: Emergency Medicine

## 2020-09-01 ENCOUNTER — Other Ambulatory Visit: Payer: Self-pay

## 2020-09-01 ENCOUNTER — Emergency Department (HOSPITAL_COMMUNITY): Payer: Self-pay

## 2020-09-01 DIAGNOSIS — G8929 Other chronic pain: Secondary | ICD-10-CM

## 2020-09-01 DIAGNOSIS — X500XXA Overexertion from strenuous movement or load, initial encounter: Secondary | ICD-10-CM | POA: Insufficient documentation

## 2020-09-01 DIAGNOSIS — S39012A Strain of muscle, fascia and tendon of lower back, initial encounter: Secondary | ICD-10-CM | POA: Insufficient documentation

## 2020-09-01 DIAGNOSIS — F17210 Nicotine dependence, cigarettes, uncomplicated: Secondary | ICD-10-CM | POA: Insufficient documentation

## 2020-09-01 DIAGNOSIS — Y9389 Activity, other specified: Secondary | ICD-10-CM | POA: Insufficient documentation

## 2020-09-01 DIAGNOSIS — Y990 Civilian activity done for income or pay: Secondary | ICD-10-CM | POA: Insufficient documentation

## 2020-09-01 DIAGNOSIS — Y9289 Other specified places as the place of occurrence of the external cause: Secondary | ICD-10-CM | POA: Insufficient documentation

## 2020-09-01 DIAGNOSIS — Z7982 Long term (current) use of aspirin: Secondary | ICD-10-CM | POA: Insufficient documentation

## 2020-09-01 MED ORDER — NAPROXEN 500 MG PO TABS: ORAL_TABLET | ORAL | 1 refills | Status: DC

## 2020-09-01 NOTE — Discharge Instructions (Addendum)
Follow-up with a family doctor or Dr. Everardo Pacific if any problems

## 2020-09-01 NOTE — ED Triage Notes (Signed)
Patient c/o right lower back pain x 3 days. 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 Paitent denies any pain radiating down his right leg.

## 2020-09-01 NOTE — ED Provider Notes (Signed)
[facility]-EMERGENCY DEPT Provider Note   CSN: [id number] Arrival date & time: 09/01/20  4098     History No chief complaint on file.   Andre Lucas is a 40 y.o. male.  Patient complains of lower back pain.  He was lifting heavy objects at work and started having pain in his back but no pain down his right leg  The history is provided by the patient and medical records. No language interpreter was used.  Back Pain Location:  Lumbar spine Quality:  Aching Radiates to:  Does not radiate Pain severity:  Moderate Pain is:  Unable to specify Onset quality:  Sudden Timing:  Constant Progression:  Waxing and waning Chronicity:  New Context: not emotional stress   Relieved by:  Nothing Worsened by:  Nothing Ineffective treatments:  None tried Associated symptoms: no abdominal pain, no chest pain and no headaches       History reviewed. No pertinent past medical history.  There are no problems to display for this patient.   History reviewed. No pertinent surgical history.     Family History  Problem Relation Age of Onset   Healthy Mother    Healthy Father     Social History   Tobacco Use   Smoking status: Every Day    Packs/day: 0.50    Years: 5.00    Pack years: 2.50    Types: Cigarettes   Smokeless tobacco: Never  Vaping Use   Vaping Use: Never used  Substance Use Topics   Alcohol use: Yes   Drug use: Yes    Types: Marijuana    Home Medications Prior to Admission medications   Medication Sig Start Date End Date Taking? Authorizing Provider  naproxen (NAPROSYN) 500 MG tablet One po bid prn pain 09/01/20  Yes Bethann Berkshire, MD  acetaminophen (TYLENOL) 500 MG tablet Take 1,000 mg by mouth every 6 (six) hours as needed for mild pain or headache.    [provider]  ASPIRIN PO Take by mouth.    [provider]  cetirizine (ZYRTEC) 10 MG tablet Take 10 mg by mouth daily.    [provider]  cyclobenzaprine  (FLEXERIL) 10 MG tablet Take 1 tablet (10 mg total) by mouth 2 (two) times daily as needed for muscle spasms. 06/12/20   Fayrene Helper, PA-C  ibuprofen (ADVIL) 600 MG tablet Take 1 tablet (600 mg total) by mouth every 6 (six) hours as needed. 06/12/20   Fayrene Helper, PA-C  predniSONE (DELTASONE) 20 MG tablet Take 1 tablet (20 mg total) by mouth 2 (two) times daily with a meal. 04/04/18   Eustace Moore, MD    Allergies    Patient has no known allergies.  Review of Systems   Review of Systems  Constitutional:  Negative for appetite change and fatigue.  HENT:  Negative for congestion, ear discharge and sinus pressure.   Eyes:  Negative for discharge.  Respiratory:  Negative for cough.   Cardiovascular:  Negative for chest pain.  Gastrointestinal:  Negative for abdominal pain and diarrhea.  Genitourinary:  Negative for frequency and hematuria.  Musculoskeletal:  Positive for back pain.  Skin:  Negative for rash.  Neurological:  Negative for seizures and headaches.  Psychiatric/Behavioral:  Negative for hallucinations.    Physical Exam Updated Vital Signs BP 126/87   Pulse (!) 55   Temp 98 F (36.7 C) (Oral)   Resp 16   Ht 5\' 5"  (1.651 m)   Wt 77.1 kg  SpO2 100%   BMI 28.29 kg/m   Physical Exam Vitals and nursing note reviewed.  Constitutional:      Appearance: He is well-developed.  HENT:     Head: Normocephalic.     Nose: Nose normal.  Eyes:     General: No scleral icterus.    Conjunctiva/sclera: Conjunctivae normal.  Neck:     Thyroid: No thyromegaly.  Cardiovascular:     Rate and Rhythm: Normal rate and regular rhythm.     Heart sounds: No murmur heard.   No friction rub. No gallop.  Pulmonary:     Breath sounds: No stridor. No wheezing or rales.  Chest:     Chest wall: No tenderness.  Abdominal:     General: There is no distension.     Tenderness: There is no abdominal tenderness. There is no rebound.  Musculoskeletal:        General: Normal range of motion.      Cervical back: Neck supple.     Comments: Tender lumbar muscles  Lymphadenopathy:     Cervical: No cervical adenopathy.  Skin:    Findings: No erythema or rash.  Neurological:     Mental Status: He is alert and oriented to person, place, and time.     Motor: No abnormal muscle tone.     Coordination: Coordination normal.  Psychiatric:        Behavior: Behavior normal.    ED Results / Procedures / Treatments   Labs (all labs ordered are listed, but only abnormal results are displayed) Labs Reviewed - No data to display  EKG None  Radiology DG Lumbar Spine Complete  Result Date: 09/01/2020 CLINICAL DATA:  Lumbar spine radiographs for pain. EXAM: LUMBAR SPINE - COMPLETE 4+ VIEW COMPARISON:  None. FINDINGS: There is no evidence of lumbar spine fracture. Alignment is normal. Intervertebral disc spaces are maintained. Mild spurring at the left sacroiliac joint. IMPRESSION: Normal lumbar spine. Electronically Signed   By: Marnee Spring M.D.   On: 09/01/2020 11:24    Procedures Procedures   Medications Ordered in ED Medications - No data to display  ED Course  I have reviewed the triage vital signs and the nursing notes.  Pertinent labs & imaging results that were available during my care of the patient were reviewed by me and considered in my medical decision making (see chart for details).    MDM Rules/Calculators/A&P                           Patient with lumbar muscle strain.  He is placed on Naprosyn and will follow-up as needed Final Clinical Impression(s) / ED Diagnoses Final diagnoses:  None    Rx / DC Orders ED Discharge Orders          Ordered    naproxen (NAPROSYN) 500 MG tablet        09/01/20 1151             Bethann Berkshire, MD 09/01/20 1155

## 2020-11-20 ENCOUNTER — Emergency Department (HOSPITAL_COMMUNITY): Payer: No Typology Code available for payment source

## 2020-11-20 ENCOUNTER — Emergency Department (HOSPITAL_COMMUNITY)
Admission: EM | Admit: 2020-11-20 | Discharge: 2020-11-20 | Disposition: A | Discharge status: Home / Self Care | Payer: No Typology Code available for payment source | Attending: Emergency Medicine | Admitting: Emergency Medicine

## 2020-11-20 ENCOUNTER — Other Ambulatory Visit: Payer: Self-pay

## 2020-11-20 ENCOUNTER — Encounter (HOSPITAL_COMMUNITY): Payer: Self-pay | Admitting: Emergency Medicine

## 2020-11-20 DIAGNOSIS — S9002XA Contusion of left ankle, initial encounter: Secondary | ICD-10-CM | POA: Insufficient documentation

## 2020-11-20 DIAGNOSIS — F17210 Nicotine dependence, cigarettes, uncomplicated: Secondary | ICD-10-CM | POA: Insufficient documentation

## 2020-11-20 DIAGNOSIS — M25559 Pain in unspecified hip: Secondary | ICD-10-CM | POA: Insufficient documentation

## 2020-11-20 DIAGNOSIS — Y92410 Unspecified street and highway as the place of occurrence of the external cause: Secondary | ICD-10-CM | POA: Insufficient documentation

## 2020-11-20 DIAGNOSIS — Z7982 Long term (current) use of aspirin: Secondary | ICD-10-CM | POA: Insufficient documentation

## 2020-11-20 DIAGNOSIS — S8012XA Contusion of left lower leg, initial encounter: Secondary | ICD-10-CM | POA: Insufficient documentation

## 2020-11-20 DIAGNOSIS — S8992XA Unspecified injury of left lower leg, initial encounter: Secondary | ICD-10-CM | POA: Diagnosis present

## 2020-11-20 MED ORDER — NAPROXEN 500 MG PO TABS: 500.0000 mg | ORAL_TABLET | Freq: Two times a day (BID) | ORAL | 0 refills | Status: AC

## 2020-11-20 MED ORDER — KETOROLAC TROMETHAMINE 30 MG/ML IJ SOLN
30.0000 mg | Freq: Once | INTRAMUSCULAR | Status: AC
  Administered 2020-11-20: 30 mg via INTRAMUSCULAR
  Filled 2020-11-20: qty 1
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
## 2020-11-20 NOTE — ED Notes (Signed)
Pt ambulated to room without assistance.
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
## 2020-11-20 NOTE — Discharge Instructions (Signed)
Take the anti-inflammatory medicine twice a day with food and water. Expect to have some muscle pain. Return to the ED if things change or worsen.
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
